# Patient Record
Sex: Female | Born: 1965 | Race: White | Hispanic: No | Marital: Married | Smoking: Former smoker
Health system: Southern US, Community
[De-identification: ages and names within clinical notes are randomized; demographics above are authoritative.]

## PROBLEM LIST (undated history)

## (undated) DIAGNOSIS — R002 Palpitations: Secondary | ICD-10-CM

## (undated) DIAGNOSIS — Z8711 Personal history of peptic ulcer disease: Secondary | ICD-10-CM

## (undated) DIAGNOSIS — Z8719 Personal history of other diseases of the digestive system: Secondary | ICD-10-CM

## (undated) DIAGNOSIS — R519 Headache, unspecified: Secondary | ICD-10-CM

## (undated) DIAGNOSIS — R51 Headache: Secondary | ICD-10-CM

## (undated) HISTORY — DX: Personal history of other diseases of the digestive system: Z87.19

## (undated) HISTORY — DX: Headache, unspecified: R51.9

## (undated) HISTORY — DX: Headache: R51

## (undated) HISTORY — DX: Palpitations: R00.2

## (undated) HISTORY — PX: AUGMENTATION MAMMAPLASTY: SUR837

## (undated) HISTORY — DX: Personal history of peptic ulcer disease: Z87.11

---

## 2003-05-13 ENCOUNTER — Other Ambulatory Visit: Admission: RE | Admit: 2003-05-13 | Discharge: 2003-05-13 | Payer: Self-pay | Admitting: Family Medicine

## 2004-11-13 ENCOUNTER — Other Ambulatory Visit: Admission: RE | Admit: 2004-11-13 | Discharge: 2004-11-13 | Payer: Self-pay | Admitting: Obstetrics and Gynecology

## 2005-12-08 ENCOUNTER — Ambulatory Visit: Payer: Self-pay | Admitting: Internal Medicine

## 2006-04-27 ENCOUNTER — Ambulatory Visit: Payer: Self-pay | Admitting: Internal Medicine

## 2007-01-02 ENCOUNTER — Ambulatory Visit: Payer: Self-pay | Admitting: Internal Medicine

## 2007-04-18 ENCOUNTER — Ambulatory Visit: Payer: Self-pay | Admitting: Internal Medicine

## 2008-01-31 ENCOUNTER — Encounter: Admission: RE | Admit: 2008-01-31 | Discharge: 2008-01-31 | Payer: Self-pay | Admitting: Obstetrics & Gynecology

## 2009-05-05 ENCOUNTER — Encounter: Admission: RE | Admit: 2009-05-05 | Discharge: 2009-05-05 | Payer: Self-pay | Admitting: Obstetrics & Gynecology

## 2010-05-07 ENCOUNTER — Encounter: Admission: RE | Admit: 2010-05-07 | Discharge: 2010-05-07 | Payer: Self-pay | Admitting: Obstetrics & Gynecology

## 2010-12-13 ENCOUNTER — Encounter: Payer: Self-pay | Admitting: Obstetrics & Gynecology

## 2011-06-30 ENCOUNTER — Other Ambulatory Visit: Payer: Self-pay | Admitting: Obstetrics & Gynecology

## 2011-06-30 DIAGNOSIS — Z1231 Encounter for screening mammogram for malignant neoplasm of breast: Secondary | ICD-10-CM

## 2011-07-09 ENCOUNTER — Ambulatory Visit: Payer: Self-pay

## 2011-07-12 ENCOUNTER — Ambulatory Visit
Admission: RE | Admit: 2011-07-12 | Discharge: 2011-07-12 | Disposition: A | Discharge status: Home / Self Care | Payer: 59 | Source: Ambulatory Visit | Attending: Obstetrics & Gynecology | Admitting: Obstetrics & Gynecology

## 2011-07-12 DIAGNOSIS — Z1231 Encounter for screening mammogram for malignant neoplasm of breast: Secondary | ICD-10-CM

## 2012-06-13 ENCOUNTER — Other Ambulatory Visit: Payer: Self-pay | Admitting: Obstetrics & Gynecology

## 2012-06-13 DIAGNOSIS — Z1231 Encounter for screening mammogram for malignant neoplasm of breast: Secondary | ICD-10-CM

## 2012-06-13 DIAGNOSIS — Z9882 Breast implant status: Secondary | ICD-10-CM

## 2012-07-13 ENCOUNTER — Ambulatory Visit
Admission: RE | Admit: 2012-07-13 | Discharge: 2012-07-13 | Disposition: A | Discharge status: Home / Self Care | Payer: 59 | Source: Ambulatory Visit | Attending: Obstetrics & Gynecology | Admitting: Obstetrics & Gynecology

## 2012-07-13 DIAGNOSIS — Z1231 Encounter for screening mammogram for malignant neoplasm of breast: Secondary | ICD-10-CM

## 2012-07-13 DIAGNOSIS — Z9882 Breast implant status: Secondary | ICD-10-CM

## 2012-09-04 ENCOUNTER — Other Ambulatory Visit: Payer: Self-pay | Admitting: Obstetrics & Gynecology

## 2013-07-10 ENCOUNTER — Other Ambulatory Visit: Payer: Self-pay

## 2013-07-10 DIAGNOSIS — Z1231 Encounter for screening mammogram for malignant neoplasm of breast: Secondary | ICD-10-CM

## 2013-08-23 ENCOUNTER — Ambulatory Visit
Admission: RE | Admit: 2013-08-23 | Discharge: 2013-08-23 | Disposition: A | Discharge status: Home / Self Care | Payer: 59 | Source: Ambulatory Visit

## 2013-08-23 DIAGNOSIS — Z1231 Encounter for screening mammogram for malignant neoplasm of breast: Secondary | ICD-10-CM

## 2013-09-17 ENCOUNTER — Other Ambulatory Visit: Payer: Self-pay | Admitting: Obstetrics & Gynecology

## 2014-07-24 ENCOUNTER — Other Ambulatory Visit: Payer: Self-pay

## 2014-07-24 DIAGNOSIS — Z1231 Encounter for screening mammogram for malignant neoplasm of breast: Secondary | ICD-10-CM

## 2014-08-26 ENCOUNTER — Ambulatory Visit
Admission: RE | Admit: 2014-08-26 | Discharge: 2014-08-26 | Disposition: A | Discharge status: Home / Self Care | Payer: 59 | Source: Ambulatory Visit

## 2014-08-26 DIAGNOSIS — Z1231 Encounter for screening mammogram for malignant neoplasm of breast: Secondary | ICD-10-CM

## 2014-09-23 ENCOUNTER — Other Ambulatory Visit: Payer: Self-pay | Admitting: Obstetrics & Gynecology

## 2014-09-24 LAB — CYTOLOGY - PAP

## 2015-09-19 ENCOUNTER — Other Ambulatory Visit: Payer: Self-pay

## 2015-09-19 DIAGNOSIS — Z1231 Encounter for screening mammogram for malignant neoplasm of breast: Secondary | ICD-10-CM

## 2015-09-30 ENCOUNTER — Ambulatory Visit
Admission: RE | Admit: 2015-09-30 | Discharge: 2015-09-30 | Disposition: A | Discharge status: Home / Self Care | Payer: PRIVATE HEALTH INSURANCE | Source: Ambulatory Visit

## 2015-09-30 DIAGNOSIS — Z1231 Encounter for screening mammogram for malignant neoplasm of breast: Secondary | ICD-10-CM

## 2015-10-02 ENCOUNTER — Other Ambulatory Visit: Payer: Self-pay | Admitting: Obstetrics & Gynecology

## 2015-10-02 DIAGNOSIS — R928 Other abnormal and inconclusive findings on diagnostic imaging of breast: Secondary | ICD-10-CM

## 2015-10-08 ENCOUNTER — Ambulatory Visit
Admission: RE | Admit: 2015-10-08 | Discharge: 2015-10-08 | Disposition: A | Discharge status: Home / Self Care | Payer: PRIVATE HEALTH INSURANCE | Source: Ambulatory Visit | Attending: Obstetrics & Gynecology | Admitting: Obstetrics & Gynecology

## 2015-10-08 DIAGNOSIS — R928 Other abnormal and inconclusive findings on diagnostic imaging of breast: Secondary | ICD-10-CM

## 2015-10-21 ENCOUNTER — Telehealth: Payer: Self-pay | Admitting: Cardiovascular Disease

## 2015-10-21 NOTE — Telephone Encounter (Signed)
Received records from [facility] for appointment on 11/07/15 with Dr Oval Linsey.  Records given to [facility] (medical records) for Dr Blenda Mounts schedule on 11/07/15. lp

## 2015-11-07 ENCOUNTER — Ambulatory Visit: Payer: PRIVATE HEALTH INSURANCE | Admitting: Cardiovascular Disease

## 2015-11-10 ENCOUNTER — Ambulatory Visit (INDEPENDENT_AMBULATORY_CARE_PROVIDER_SITE_OTHER): Payer: PRIVATE HEALTH INSURANCE | Admitting: Cardiovascular Disease

## 2015-11-10 ENCOUNTER — Ambulatory Visit: Payer: PRIVATE HEALTH INSURANCE

## 2015-11-10 ENCOUNTER — Encounter: Payer: Self-pay | Admitting: Cardiovascular Disease

## 2015-11-10 VITALS — BP 138/90 | HR 72 | Ht 62.0 in | Wt 142.9 lb

## 2015-11-10 DIAGNOSIS — R002 Palpitations: Secondary | ICD-10-CM

## 2015-11-10 NOTE — Progress Notes (Signed)
     11/10/2015 Rachel Mcneil   05-31-1966  [id number]  Primary Physician Rachel Fruit, MD Primary Cardiologist: Rachel Harp MD Rachel Mcneil   HPI:  Mr. Rachel Mcneil is a delightful 49 year old thin-appearing married Caucasian female mother of one 59 year old child who is retired Cabin crew and was referred by Dr. Stann Mcneil, her OB/GYN for evaluation of palpitations. She has no cardiac risk factors. 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 She's been under a lot of stress since that time. She does admit to drinking 3 cups of coffee a morning and ice tea throughout the day. There is no family history for heart disease. She's never had a heart attack or stroke and denies chest pain or shortness of breath. Her primary care physician has ordered a full set of labs to be drawn next week.   No current outpatient prescriptions on file.   No current facility-administered medications for this visit.    No Known Allergies  Social History   Social History  . Marital Status: Married    Spouse Name: N/A  . Number of Children: N/A  . Years of Education: N/A   Occupational History  . Not on file.   Social History Main Topics  . Smoking status: Former Research scientist (life sciences)  . Smokeless tobacco: Never Used  . Alcohol Use: Not on file  . Drug Use: Not on file  . Sexual Activity: Not on file   Other Topics Concern  . Not on file   Social History Narrative  . No narrative on file     Review of Systems: General: negative for chills, fever, night sweats or weight changes.  Cardiovascular: negative for chest pain, dyspnea on exertion, edema, orthopnea, palpitations, paroxysmal nocturnal dyspnea or shortness of breath Dermatological: negative for rash Respiratory: negative for cough or wheezing Urologic: negative for hematuria Abdominal: negative for nausea, vomiting, diarrhea, bright red blood per rectum, melena, or hematemesis Neurologic: negative for visual changes, syncope, or  dizziness All other systems reviewed and are otherwise negative except as noted above.    Blood pressure 138/90, pulse 72, height 5\' 2"  (1.575 m), weight 142 lb 14.4 oz (64.819 kg).  General appearance: alert and no distress Neck: no adenopathy, no carotid bruit, no JVD, supple, symmetrical, trachea midline and thyroid not enlarged, symmetric, no tenderness/mass/nodules Lungs: clear to auscultation bilaterally Heart: regular rate and rhythm, S1, S2 normal, no murmur, click, rub or gallop Extremities: extremities normal, atraumatic, no cyanosis or edema  EKG normal sinus rhythm at 72 with a ST or T wave changes. I personally reviewed this EKG  ASSESSMENT AND PLAN:   Palpitations Rachel Mcneil is a very delightful 49 year old thin and fit appearing very Caucasian female mother of one 60 year old son who is referred by Dr. Stann Mcneil , her OB/GYN, for evaluation of palpitations.she does admit to being under a lot of stress having retired within the last year. She drinks 3 cups of coffee in the morning and ice tea throughout the day. She denies chest pain, shortness of breath or presyncope. I'm going to get a third event monitor and will see her back after that. We talked about the importance of reducing her caffeine intake.      Rachel Harp MD FACP,FACC,FAHA, [facility] 11/10/2015 5:04 PM

## 2015-11-10 NOTE — Patient Instructions (Addendum)
Medication Instructions:  Your physician recommends that you continue on your current medications as directed. Please refer to the Current Medication list given to you today.  Labwork: NONE  Testing/Procedures: Your physician has recommended that you wear an 30 day event monitor. Event monitors are medical devices that record the heart's electrical activity. Doctors most often [place] these monitors to diagnose arrhythmias. Arrhythmias are problems with the speed or rhythm of the heartbeat. The monitor is a small, portable device. You can wear one while you do your normal daily activities. This is usually used to diagnose what is causing palpitations/syncope (passing out).  Follow-Up: We request that you follow-up in:  6 weeks with Dr Gwenlyn Found   If you need a refill on your cardiac medications before your next appointment, please call your pharmacy.

## 2015-11-10 NOTE — Assessment & Plan Note (Signed)
Rachel Mcneil is a very delightful 49 year old thin and fit appearing very Caucasian female mother of one 65 year old son who is referred by Dr. Stann Mainland , her OB/GYN, for evaluation of palpitations.she does admit to being under a lot of stress having retired within the last year. She drinks 3 cups of coffee in the morning and ice tea throughout the day. She denies chest pain, shortness of breath or presyncope. I'm going to get a third event monitor and will see her back after that. We talked about the importance of reducing her caffeine intake.

## 2015-11-12 ENCOUNTER — Encounter (INDEPENDENT_AMBULATORY_CARE_PROVIDER_SITE_OTHER): Payer: PRIVATE HEALTH INSURANCE

## 2015-11-12 DIAGNOSIS — R002 Palpitations: Secondary | ICD-10-CM

## 2016-01-09 ENCOUNTER — Ambulatory Visit: Payer: PRIVATE HEALTH INSURANCE | Admitting: Cardiovascular Disease

## 2016-09-24 ENCOUNTER — Other Ambulatory Visit: Payer: Self-pay | Admitting: Obstetrics & Gynecology

## 2016-09-24 DIAGNOSIS — Z1231 Encounter for screening mammogram for malignant neoplasm of breast: Secondary | ICD-10-CM

## 2016-10-22 ENCOUNTER — Ambulatory Visit
Admission: RE | Admit: 2016-10-22 | Discharge: 2016-10-22 | Disposition: A | Discharge status: Home / Self Care | Payer: PRIVATE HEALTH INSURANCE | Source: Ambulatory Visit | Attending: Obstetrics & Gynecology | Admitting: Obstetrics & Gynecology

## 2016-10-22 DIAGNOSIS — Z1231 Encounter for screening mammogram for malignant neoplasm of breast: Secondary | ICD-10-CM

## 2017-11-25 ENCOUNTER — Other Ambulatory Visit: Payer: Self-pay | Admitting: Obstetrics & Gynecology

## 2017-11-25 DIAGNOSIS — Z1239 Encounter for other screening for malignant neoplasm of breast: Secondary | ICD-10-CM

## 2017-11-29 ENCOUNTER — Ambulatory Visit
Admission: RE | Admit: 2017-11-29 | Discharge: 2017-11-29 | Disposition: A | Discharge status: Home / Self Care | Payer: PRIVATE HEALTH INSURANCE | Source: Ambulatory Visit | Attending: Obstetrics & Gynecology | Admitting: Obstetrics & Gynecology

## 2017-11-29 ENCOUNTER — Ambulatory Visit: Payer: PRIVATE HEALTH INSURANCE

## 2017-11-29 DIAGNOSIS — Z1239 Encounter for other screening for malignant neoplasm of breast: Secondary | ICD-10-CM

## 2018-07-26 ENCOUNTER — Encounter: Payer: Self-pay | Admitting: Family Medicine

## 2018-07-26 ENCOUNTER — Ambulatory Visit (INDEPENDENT_AMBULATORY_CARE_PROVIDER_SITE_OTHER): Payer: PRIVATE HEALTH INSURANCE

## 2018-07-26 ENCOUNTER — Ambulatory Visit (INDEPENDENT_AMBULATORY_CARE_PROVIDER_SITE_OTHER): Payer: PRIVATE HEALTH INSURANCE | Admitting: Family Medicine

## 2018-07-26 VITALS — BP 130/84 | HR 65 | Temp 98.1°F | Ht 62.0 in | Wt 149.6 lb

## 2018-07-26 DIAGNOSIS — Z0001 Encounter for general adult medical examination with abnormal findings: Secondary | ICD-10-CM

## 2018-07-26 DIAGNOSIS — M25552 Pain in left hip: Secondary | ICD-10-CM

## 2018-07-26 DIAGNOSIS — Z975 Presence of (intrauterine) contraceptive device: Secondary | ICD-10-CM | POA: Insufficient documentation

## 2018-07-26 DIAGNOSIS — Z23 Encounter for immunization: Secondary | ICD-10-CM | POA: Diagnosis not present

## 2018-07-26 DIAGNOSIS — R5383 Other fatigue: Secondary | ICD-10-CM

## 2018-07-26 DIAGNOSIS — Z114 Encounter for screening for human immunodeficiency virus [HIV]: Secondary | ICD-10-CM

## 2018-07-26 DIAGNOSIS — Z0000 Encounter for general adult medical examination without abnormal findings: Secondary | ICD-10-CM

## 2018-07-26 DIAGNOSIS — Z1211 Encounter for screening for malignant neoplasm of colon: Secondary | ICD-10-CM

## 2018-07-26 LAB — CBC WITH DIFFERENTIAL/PLATELET
Basophils Absolute: 0 10*3/uL (ref 0.0–0.1)
Basophils Relative: 0.7 % (ref 0.0–3.0)
EOS ABS: 0.1 10*3/uL (ref 0.0–0.7)
Eosinophils Relative: 0.9 % (ref 0.0–5.0)
HEMATOCRIT: 42.5 % (ref 36.0–46.0)
HEMOGLOBIN: 14.8 g/dL (ref 12.0–15.0)
LYMPHS PCT: 19.2 % (ref 12.0–46.0)
Lymphs Abs: 1.2 10*3/uL (ref 0.7–4.0)
MCHC: 34.9 g/dL (ref 30.0–36.0)
MCV: 93.2 fl (ref 78.0–100.0)
MONOS PCT: 8.6 % (ref 3.0–12.0)
Monocytes Absolute: 0.5 10*3/uL (ref 0.1–1.0)
Neutro Abs: 4.5 10*3/uL (ref 1.4–7.7)
Neutrophils Relative %: 70.6 % (ref 43.0–77.0)
Platelets: 286 10*3/uL (ref 150.0–400.0)
RBC: 4.56 Mil/uL (ref 3.87–5.11)
RDW: 12.9 % (ref 11.5–15.5)
WBC: 6.4 10*3/uL (ref 4.0–10.5)

## 2018-07-26 LAB — COMPREHENSIVE METABOLIC PANEL
ALBUMIN: 4.1 g/dL (ref 3.5–5.2)
ALT: 11 U/L (ref 0–35)
AST: 13 U/L (ref 0–37)
Alkaline Phosphatase: 36 U/L — ABNORMAL LOW (ref 39–117)
BILIRUBIN TOTAL: 0.7 mg/dL (ref 0.2–1.2)
BUN: 14 mg/dL (ref 6–23)
CALCIUM: 9.4 mg/dL (ref 8.4–10.5)
CHLORIDE: 102 meq/L (ref 96–112)
CO2: 30 mEq/L (ref 19–32)
CREATININE: 0.64 mg/dL (ref 0.40–1.20)
GFR: 103.72 mL/min (ref >60.00)
Glucose, Bld: 80 mg/dL (ref 70–99)
Potassium: 4.1 mEq/L (ref 3.5–5.1)
SODIUM: 136 meq/L (ref 135–145)
Total Protein: 6.6 g/dL (ref 6.0–8.3)

## 2018-07-26 LAB — LUTEINIZING HORMONE: LH: 2.3 m[IU]/mL

## 2018-07-26 LAB — LIPID PANEL
CHOLESTEROL: 204 mg/dL — AB (ref 0–200)
HDL: 62.7 mg/dL (ref >39.00)
LDL CALC: 122 mg/dL — AB (ref 0–99)
NonHDL: 141.21
Total CHOL/HDL Ratio: 3
Triglycerides: 95 mg/dL (ref 0.0–149.0)
VLDL: 19 mg/dL (ref 0.0–40.0)

## 2018-07-26 LAB — TSH: TSH: 1.39 u[IU]/mL (ref 0.35–4.50)

## 2018-07-26 LAB — FOLLICLE STIMULATING HORMONE: FSH: 2.8 m[IU]/mL

## 2018-07-26 LAB — VITAMIN D 25 HYDROXY (VIT D DEFICIENCY, FRACTURES): VITD: 20.15 ng/mL — ABNORMAL LOW (ref 30.00–100.00)

## 2018-07-26 NOTE — Patient Instructions (Addendum)
Health Maintenance Due  Topic Date Due  . HIV Screening -ordered today 12/09/1981  . TETANUS/TDAP -ordered today 12/09/1985  . COLONOSCOPY  12/09/2016  . INFLUENZA VACCINE -pt declines 06/22/2018   Tumeric for arthritis.  If you take ibuprofen could take with food and a zantac.

## 2018-07-26 NOTE — Progress Notes (Signed)
Patient: Rachel Mcneil MRN: [id number] DOB: [birth year] PCP: Vania Rea, MD     Subjective:  Chief Complaint  Patient presents with  . Establish Care  . needs referral for ortho    left hip pain    HPI: The patient is a 52 y.o. female who presents today for annual exam. She denies any changes to past medical history. There have been no recent hospitalizations. They are following a well balanced diet and exercise plan. Weight has been decreasing steadily.  She is not sure if menopausal. Has IUD. She is wondering if she is pre menopausal and wondering about hormone levels. She has no motivation to do stuff and feels more fatigued. She denies any depression, but also feels more snappy lately. She has had a few night sweats as well.    Left hip pain: she has had left hip pain since February. No injury that she recalls. Pain is pretty constant and has to sit a certain way. Pain comes and goes, but is daily. Pain rated 6/10 and is described as dull. Pain does not radiate. She has not taken otc pain pills and nothing seems to make it better but laying down. Walking/weight bearing makes it worse. Occasionally has left knee pain.   Immunization History  Administered Date(s) Administered  . Tdap 07/26/2018     Colonoscopy: never had this.  Mammogram: 08/2017: normal  Pap smear: 2 years ago. Normal.  Tdap: unsure.    Review of Systems  Constitutional: Positive for activity change and fatigue. Negative for appetite change.  Respiratory: Negative for shortness of breath.   Cardiovascular: Negative for chest pain.  Gastrointestinal: Negative for abdominal pain and nausea.  Musculoskeletal: Positive for arthralgias (left hip pain). Negative for back pain and neck pain.  Neurological: Negative for dizziness and headaches.  Psychiatric/Behavioral: Negative for sleep disturbance. The patient is not nervous/anxious.     Allergies Patient has No Known Allergies.  Past Medical  History Patient  has a past medical history of Frequent headaches, History of stomach ulcers, and Palpitations.  Surgical History Patient  has a past surgical history that includes Augmentation mammaplasty (Bilateral).  Family History Pateint's family history includes Cancer in her father; Early death in her father; Heart disease in her mother; Hyperlipidemia in her mother; Hypertension in her mother; Pancreatic cancer in her father; Stroke in her mother.  Social History Patient  reports that she has quit smoking. Her smoking use included cigarettes. She has never used smokeless tobacco. She reports that she drank alcohol. She reports that she does not use drugs.    Objective: Vitals:   07/26/18 1014 07/26/18 1049  BP: 120/90 130/84  Pulse: 65   Temp: 98.1 F (36.7 C)   TempSrc: Oral   SpO2: 98%   Weight: 149 lb 9.6 oz (67.9 kg)   Height: 5\' 2"  (1.575 m)     Body mass index is 27.36 kg/m.  Physical Exam  Constitutional: She is oriented to person, place, and time. She appears well-developed and well-nourished.  HENT:  Right Ear: External ear normal.  Left Ear: External ear normal.  Mouth/Throat: Oropharynx is clear and moist.  Eyes: Pupils are equal, round, and reactive to light. Conjunctivae and EOM are normal.  Neck: Normal range of motion. Neck supple. No thyromegaly present.  Cardiovascular: Normal rate, regular rhythm, normal heart sounds and intact distal pulses.  No murmur heard. Pulmonary/Chest: Effort normal and breath sounds normal.  Abdominal: Soft. Bowel sounds are normal. She exhibits no distension.  There is no tenderness.  Musculoskeletal: Normal range of motion. She exhibits tenderness (over left hip).  Lymphadenopathy:    She has no cervical adenopathy.  Neurological: She is alert and oriented to person, place, and time. She displays normal reflexes. No cranial nerve deficit. Coordination normal.  Skin: Skin is warm and dry. No rash noted.  Psychiatric: She  has a normal mood and affect. Her behavior is normal.  Vitals reviewed.      Assessment/plan: 1. Annual physical exam utd on her mmg/pap. Giving tdap today and ordering screening cscope.  Patient counseling [x]    Nutrition: Stressed importance of moderation in sodium/caffeine intake, saturated fat and cholesterol, caloric balance, sufficient intake of fresh fruits, vegetables, fiber, calcium, iron, and 1 mg of folate supplement per day (for females capable of pregnancy).  [x]    Stressed the importance of regular exercise.   []    Substance Abuse: Discussed cessation/primary prevention of tobacco, alcohol, or other drug use; driving or other dangerous activities under the influence; availability of treatment for abuse.   [x]    Injury prevention: Discussed safety belts, safety helmets, smoke detector, smoking near bedding or upholstery.   [x]    Sexuality: Discussed sexually transmitted diseases, partner selection, use of condoms, avoidance of unintended pregnancy  and contraceptive alternatives.  [x]    Dental health: Discussed importance of regular tooth brushing, flossing, and dental visits.  [x]    Health maintenance and immunizations reviewed. Please refer to Health maintenance section.   - CBC with Differential/Platelet - TSH - Comprehensive metabolic panel - Lipid panel  2. Other fatigue Will check her hormones. May be secondary to menopause, she is at mean age for this. Also check vitamin D and other labs to rule out other causes. Recommended continued exercise/sleep. F/u on labs.  - VITAMIN D 25 Hydroxy (Vit-D Deficiency, Fractures) - Luteinizing hormone - Follicle stimulating hormone  3. Encounter for screening for HIV  - HIV antibody  4. Need for prophylactic vaccination with combined diphtheria-tetanus-pertussis (DTP) vaccine  - Tdap vaccine greater than or equal to 7yo IM  5. Left hip pain Bursitis vs. OA. She is going to see who she would like to be referred to. Will let  me know. Recommended daily tumeric and NSAIDs for severe pain since she does not like taking medication. Recommended she take this with food.   - DG HIP UNILAT W OR W/O PELVIS 2-3 VIEWS LEFT; Future  6. Encounter for screening colonoscopy discussed screening for colon cancer.  - Ambulatory referral to Gastroenterology    Return in about 1 year (around 07/27/2019).     Orma Flaming, MD [facility]  07/26/2018

## 2018-07-27 ENCOUNTER — Other Ambulatory Visit: Payer: Self-pay | Admitting: Family Medicine

## 2018-07-27 DIAGNOSIS — M25552 Pain in left hip: Secondary | ICD-10-CM

## 2018-07-27 LAB — HIV ANTIBODY (ROUTINE TESTING W REFLEX): HIV: NONREACTIVE

## 2018-07-27 MED ORDER — VITAMIN D (ERGOCALCIFEROL) 1.25 MG (50000 UNIT) PO CAPS: ORAL_CAPSULE | ORAL | 0 refills | Status: DC

## 2018-07-27 NOTE — Progress Notes (Signed)
Vit  

## 2018-09-14 ENCOUNTER — Encounter: Payer: Self-pay | Admitting: Family Medicine

## 2018-10-22 ENCOUNTER — Other Ambulatory Visit: Payer: Self-pay | Admitting: Family Medicine

## 2018-11-29 ENCOUNTER — Encounter: Payer: Self-pay | Admitting: Gastroenterology

## 2018-11-30 ENCOUNTER — Ambulatory Visit (INDEPENDENT_AMBULATORY_CARE_PROVIDER_SITE_OTHER): Payer: Self-pay | Admitting: Family Medicine

## 2018-11-30 ENCOUNTER — Encounter: Payer: Self-pay | Admitting: Family Medicine

## 2018-11-30 ENCOUNTER — Other Ambulatory Visit (HOSPITAL_COMMUNITY)
Admission: RE | Admit: 2018-11-30 | Discharge: 2018-11-30 | Disposition: A | Discharge status: Home / Self Care | Payer: 59 | Source: Ambulatory Visit | Attending: Family Medicine | Admitting: Family Medicine

## 2018-11-30 VITALS — BP 110/66 | HR 71 | Temp 98.1°F | Ht 62.0 in | Wt 148.8 lb

## 2018-11-30 DIAGNOSIS — Z01419 Encounter for gynecological examination (general) (routine) without abnormal findings: Secondary | ICD-10-CM | POA: Diagnosis not present

## 2018-11-30 DIAGNOSIS — R8290 Unspecified abnormal findings in urine: Secondary | ICD-10-CM

## 2018-11-30 DIAGNOSIS — Z1151 Encounter for screening for human papillomavirus (HPV): Secondary | ICD-10-CM | POA: Diagnosis not present

## 2018-11-30 DIAGNOSIS — Z975 Presence of (intrauterine) contraceptive device: Secondary | ICD-10-CM | POA: Diagnosis not present

## 2018-11-30 LAB — POCT URINALYSIS DIPSTICK
Bilirubin, UA: NEGATIVE
GLUCOSE UA: NEGATIVE
KETONES UA: NEGATIVE
LEUKOCYTES UA: NEGATIVE
NITRITE UA: NEGATIVE
PROTEIN UA: NEGATIVE
SPEC GRAV UA: 1.025 (ref 1.010–1.025)
Urobilinogen, UA: 2 E.U./dL — AB
pH, UA: 6 (ref 5.0–8.0)

## 2018-11-30 NOTE — Progress Notes (Signed)
SUBJECTIVE:  53 y.o. female for annual routine Pap and checkup. She currently has IUD in place. Having very irregular nad prolonged periods with spotting. Menses at age 8 years of age. Normal periods. Her periods just recently became abnormal. IUD in place 2016. No vaginal complaints. No pain with sex. She does have cloudy urine with a smell, but no dysuria. She does not drink enough water. No breast complaints. Due for mmg this month. No hx of breat cancer or colon cancer. Getting colonoscopy next week.   Current Outpatient Medications  Medication Sig Dispense Refill  . cholecalciferol (VITAMIN D3) 25 MCG (1000 UT) tablet Take 1,000 Units by mouth daily.     No current facility-administered medications for this visit.    Allergies: Patient has no known allergies.  No LMP recorded (lmp unknown). (Menstrual status: IUD).  ROS:  Feeling well. No dyspnea or chest pain on exertion.  No abdominal pain, change in bowel habits, black or bloody stools.  No urinary tract symptoms. GYN ROS: normal menses, no abnormal bleeding, pelvic pain or discharge, no breast pain or new or enlarging lumps on self exam. No neurological complaints.  OBJECTIVE:  The patient appears well, alert, oriented x 3, in no distress. BP 110/66 (BP Location: Left Arm, Patient Position: Sitting, Cuff Size: Normal)   Pulse 71   Temp 98.1 F (36.7 C) (Oral)   Ht 5\' 2"  (1.575 m)   Wt 148 lb 12.8 oz (67.5 kg)   LMP  (LMP Unknown)   SpO2 98%   BMI 27.22 kg/m  ENT normal.  Neck supple. No adenopathy or thyromegaly. PERLA. Lungs are clear, good air entry, no wheezes, rhonchi or rales. S1 and S2 normal, no murmurs, regular rate and rhythm. Abdomen soft without tenderness, guarding, mass or organomegaly. Extremities show no edema, normal peripheral pulses. Neurological is normal, no focal findings.  BREAST EXAM: breasts appear normal, no suspicious masses, no skin or nipple changes or axillary nodes  PELVIC EXAM: normal external  genitalia, vulva, vagina, cervix, uterus and adnexa  ASSESSMENT:  well woman  PLAN:  mammogram pap smear return annually or prn cscope next week UA/culture for urine odor/color change-sounds more concentrated. Recommended increase water intake.   Come back for annual/labs this year.   Orma Flaming, MD [facility]

## 2018-12-01 LAB — CYTOLOGY - PAP
DIAGNOSIS: NEGATIVE
HPV (WINDOPATH): NOT DETECTED

## 2018-12-01 LAB — URINE CULTURE
MICRO NUMBER: 33824
RESULT: NO GROWTH
SPECIMEN QUALITY:: ADEQUATE

## 2018-12-05 ENCOUNTER — Ambulatory Visit (AMBULATORY_SURGERY_CENTER): Payer: Self-pay | Admitting: *Deleted

## 2018-12-05 VITALS — Ht 62.0 in | Wt 149.0 lb

## 2018-12-05 DIAGNOSIS — Z1211 Encounter for screening for malignant neoplasm of colon: Secondary | ICD-10-CM

## 2018-12-05 MED ORDER — NA SULFATE-K SULFATE-MG SULF 17.5-3.13-1.6 GM/177ML PO SOLN: 1.0000 | Freq: Once | ORAL | 0 refills | Status: AC

## 2018-12-05 NOTE — Progress Notes (Signed)
No egg or soy allergy known to patient  No issues with past sedation with any surgeries  or procedures, no intubation problems  No diet pills per patient No home 02 use per patient  No blood thinners per patient  Pt denies issues with constipation  No A fib or A flutter  EMMI video sent to pt's e mail --pt declined  Online Suprep $15 coupon to pt

## 2018-12-06 ENCOUNTER — Other Ambulatory Visit: Payer: Self-pay | Admitting: Family Medicine

## 2018-12-06 DIAGNOSIS — Z1231 Encounter for screening mammogram for malignant neoplasm of breast: Secondary | ICD-10-CM

## 2018-12-07 ENCOUNTER — Ambulatory Visit
Admission: RE | Admit: 2018-12-07 | Discharge: 2018-12-07 | Disposition: A | Discharge status: Home / Self Care | Payer: 59 | Source: Ambulatory Visit | Attending: Family Medicine | Admitting: Family Medicine

## 2018-12-07 DIAGNOSIS — Z1231 Encounter for screening mammogram for malignant neoplasm of breast: Secondary | ICD-10-CM

## 2018-12-08 ENCOUNTER — Encounter: Payer: Self-pay | Admitting: Gastroenterology

## 2018-12-13 ENCOUNTER — Encounter: Payer: Self-pay | Admitting: Gastroenterology

## 2018-12-13 ENCOUNTER — Ambulatory Visit (AMBULATORY_SURGERY_CENTER): Payer: 59 | Admitting: Gastroenterology

## 2018-12-13 VITALS — BP 111/70 | HR 66 | Temp 98.6°F | Resp 14 | Ht 62.0 in | Wt 149.0 lb

## 2018-12-13 DIAGNOSIS — D120 Benign neoplasm of cecum: Secondary | ICD-10-CM

## 2018-12-13 DIAGNOSIS — Z1211 Encounter for screening for malignant neoplasm of colon: Secondary | ICD-10-CM | POA: Diagnosis not present

## 2018-12-13 MED ORDER — SODIUM CHLORIDE 0.9 % IV SOLN: 500.0000 mL | Freq: Once | INTRAVENOUS | Status: DC

## 2018-12-13 NOTE — Progress Notes (Signed)
Called to room to assist during endoscopic procedure.  Patient ID and intended procedure confirmed with present staff. Received instructions for my participation in the procedure from the performing physician.  

## 2018-12-13 NOTE — Op Note (Signed)
[facility] Patient Name: Rachel Mcneil Procedure Date: 12/13/2018 10:50 AM MRN: [id number] Endoscopist: Remo Lipps P. Havery Moros , MD Age: 53 Referring MD:  Date of Birth: 06/29/1966 Gender: Female Account #: [id number] Procedure:                Colonoscopy Indications:              Screening for colorectal malignant neoplasm, This                            is the patient's first colonoscopy Medicines:                Monitored Anesthesia Care Procedure:                Pre-Anesthesia Assessment:                           - Prior to the procedure, a History and Physical                            was performed, and patient medications and                            allergies were reviewed. The patient's tolerance of                            previous anesthesia was also reviewed. The risks                            and benefits of the procedure and the sedation                            options and risks were discussed with the patient.                            All questions were answered, and informed consent                            was obtained. Prior Anticoagulants: The patient has                            taken no previous anticoagulant or antiplatelet                            agents. ASA Grade Assessment: II - A patient with                            mild systemic disease. After reviewing the risks                            and benefits, the patient was deemed in                            satisfactory condition to undergo the procedure.  After obtaining informed consent, the colonoscope                            was passed under direct vision. Throughout the                            procedure, the patient's blood pressure, pulse, and                            oxygen saturations were monitored continuously. The                            Model PCF-[id number]) scope was introduced                            through the anus  and advanced to the the cecum,                            identified by appendiceal orifice and ileocecal                            valve. The colonoscopy was performed without                            difficulty. The patient tolerated the procedure                            well. The quality of the bowel preparation was                            adequate. The ileocecal valve, appendiceal orifice,                            and rectum were photographed. Scope In: 10:56:27 AM Scope Out: 11:23:32 AM Scope Withdrawal Time: 0 hours 19 minutes 36 seconds  Total Procedure Duration: 0 hours 27 minutes 5 seconds  Findings:                 The perianal and digital rectal examinations were                            normal.                           The colon was quite tortuous.                           A moderate amount of semi-liquid stool was found in                            the entire colon, making visualization difficult.                            Lavage of the area was performed using copious  amounts of sterile water, resulting in clearance                            with adequate visualization. This prolonged the                            procedure, as the colonoscope severely clogged at                            one point, time taken to treat the colonoscope.                           Two sessile polyps were found in the cecum. The                            polyps were 3 to 4 mm in size. These polyps were                            removed with a cold snare. Resection and retrieval                            were complete.                           Anal papilla(e) were hypertrophied.                           The exam was otherwise without abnormality. Complications:            No immediate complications. Estimated blood loss:                            Minimal. Estimated Blood Loss:     Estimated blood loss was minimal. Impression:               -  Tortuous colon.                           - Two 3 to 4 mm polyps in the cecum, removed with a                            cold snare. Resected and retrieved.                           - Anal papilla(e) were hypertrophied.                           - The examination was otherwise normal. Recommendation:           - Patient has a contact number available for                            emergencies. The signs and symptoms of potential                            delayed  complications were discussed with the                            patient. Return to normal activities tomorrow.                            Written discharge instructions were provided to the                            patient.                           - Resume previous diet.                           - Continue present medications.                           - Await pathology results. Remo Lipps P. Leonia Heatherly, MD 12/13/2018 11:27:55 AM This report has been signed electronically.

## 2018-12-13 NOTE — Patient Instructions (Signed)
YOU HAD AN ENDOSCOPIC PROCEDURE TODAY AT THE [facility]:   Refer to the procedure report that was given to you for any specific questions about what was found during the examination.  If the procedure report does not answer your questions, please call your gastroenterologist to clarify.  If you requested that your care partner not be given the details of your procedure findings, then the procedure report has been included in a sealed envelope for you to review at your convenience later.  YOU SHOULD EXPECT: Some feelings of bloating in the abdomen. Passage of more gas than usual.  Walking can help get rid of the air that was put into your GI tract during the procedure and reduce the bloating. If you had a lower endoscopy (such as a colonoscopy or flexible sigmoidoscopy) you may notice spotting of blood in your stool or on the toilet paper. If you underwent a bowel prep for your procedure, you may not have a normal bowel movement for a few days.  Please Note:  You might notice some irritation and congestion in your nose or some drainage.  This is from the oxygen used during your procedure.  There is no need for concern and it should clear up in a day or so.  SYMPTOMS TO REPORT IMMEDIATELY:   Following lower endoscopy (colonoscopy or flexible sigmoidoscopy):  Excessive amounts of blood in the stool  Significant tenderness or worsening of abdominal pains  Swelling of the abdomen that is new, acute  Fever of 100F or higher  For urgent or emergent issues, a gastroenterologist can be reached at any hour by calling [phone].   DIET:  We do recommend a small meal at first, but then you may proceed to your regular diet.  Drink plenty of fluids but you should avoid alcoholic beverages for 24 hours.  ACTIVITY:  You should plan to take it easy for the rest of today and you should NOT DRIVE or use heavy machinery until tomorrow (because of the sedation medicines used during the test).     FOLLOW UP: Our staff will call the number listed on your records the next business day following your procedure to check on you and address any questions or concerns that you may have regarding the information given to you following your procedure. If we do not reach you, we will leave a message.  However, if you are feeling well and you are not experiencing any problems, there is no need to return our call.  We will assume that you have returned to your regular daily activities without incident.  If any biopsies were taken you will be contacted by phone or by letter within the next 1-3 weeks.  Please call us at [phone] if you have not heard about the biopsies in 3 weeks.    SIGNATURES/CONFIDENTIALITY: You and/or your care partner have signed paperwork which will be entered into your electronic medical record.  These signatures attest to the fact that that the information above on your After Visit Summary has been reviewed and is understood.  Full responsibility of the confidentiality of this discharge information lies with you and/or your care-partner. 

## 2018-12-13 NOTE — Progress Notes (Signed)
Pt's states no medical or surgical changes since previsit or office visit. 

## 2018-12-14 ENCOUNTER — Telehealth: Payer: Self-pay

## 2018-12-14 ENCOUNTER — Telehealth: Payer: Self-pay | Admitting: *Deleted

## 2018-12-14 NOTE — Telephone Encounter (Signed)
No answer, left message to call back later today, B.Necha Harries RN. 

## 2018-12-14 NOTE — Telephone Encounter (Signed)
NO ANSWER, MESSAGE LEFT FOR THE PATIENT. 

## 2019-06-27 ENCOUNTER — Encounter: Payer: Self-pay | Admitting: Internal Medicine

## 2019-06-27 ENCOUNTER — Other Ambulatory Visit: Payer: Self-pay

## 2019-06-27 ENCOUNTER — Ambulatory Visit (INDEPENDENT_AMBULATORY_CARE_PROVIDER_SITE_OTHER): Payer: 59 | Admitting: Internal Medicine

## 2019-06-27 DIAGNOSIS — R05 Cough: Secondary | ICD-10-CM

## 2019-06-27 DIAGNOSIS — R053 Chronic cough: Secondary | ICD-10-CM

## 2019-06-27 MED ORDER — HYDROCODONE-HOMATROPINE 5-1.5 MG/5ML PO SYRP: 5.0000 mL | ORAL_SOLUTION | Freq: Four times a day (QID) | ORAL | 0 refills | Status: DC | PRN

## 2019-06-27 NOTE — Progress Notes (Signed)
Virtual Visit via Video Note  I connected with@ on 06/27/19 at  3:45 PM EDT by a video enabled telemedicine application and verified that I am speaking with the correct person using two identifiers. Location patient: home Location provider:work  office Persons participating in the virtual visit: patient, provider  [place] national recommendations  regarding COVID 19 pandemic   video visit is advised over in office visit for this patient.  Patient aware  of the limitations of evaluation and management by telemedicine and  availability of in person appointments. and agreed to proceed.   HPI: Rachel Mcneil presents for video visit PCP NA  Generally well until 2 weeks ago and developed a cough  That has persisted without fever systemic sx  Sob  Rash or uri sx  May have hx of some "allergy sx congestion and sneezing but not happening now)   Just not going away and interferes with sleep    r dm somewhat helpful but not enough .    Using cough drops all the time   No ets/tobacco is a relator no exposures had neg covid testing last week.   No hx of wheezing but feels like a whistle feeling in upper chest . No sob  Remote hx of similar persistent  cough after a resp infection long time ago but no hx asthma  Lung disease  2 cats 2 dogs at home  None new .   ROS: See pertinent positives and negatives per HPI. No gerd sx  Past Medical History:  Diagnosis Date  . Frequent headaches   . History of stomach ulcers   . Palpitations     Past Surgical History:  Procedure Laterality Date  . AUGMENTATION MAMMAPLASTY Bilateral     Family History  Problem Relation Age of Onset  . Hypertension Mother   . Stroke Mother   . Hyperlipidemia Mother   . Heart disease Mother   . Pancreatic cancer Father   . Cancer Father   . Early death Father   . Colon cancer Neg Hx   . Colon polyps Neg Hx   . Rectal cancer Neg Hx   . Stomach cancer Neg Hx     Social History   Tobacco Use  . Smoking status:  Former Smoker    Types: Cigarettes  . Smokeless tobacco: Never Used  Substance Use Topics  . Alcohol use: Not Currently    Alcohol/week: 0.0 standard drinks    Comment: occ  . Drug use: Never      Current Outpatient Medications:  .  cholecalciferol (VITAMIN D3) 25 MCG (1000 UT) tablet, Take 1,000 Units by mouth daily., Disp: , Rfl:  .  HYDROcodone-homatropine (HYCODAN) 5-1.5 MG/5ML syrup, Take 5 mLs by mouth every 6 (six) hours as needed for cough., Disp: 90 mL, Rfl: 0  EXAM: BP Readings from Last 3 Encounters:  12/13/18 111/70  11/30/18 110/66  07/26/18 130/84    VITALS per patient if applicable:  GENERAL: alert, oriented, appears well and in no acute distress ocass dry cough  Episodes  Appears generally well. HEENT: atraumatic, conjunttiva clear, no obvious abnormalities on inspection of external nose and ears NECK: normal movements of the head and neck LUNGS: on inspection no signs of respiratory distress, breathing rate appears normal, no obvious gross SOB, gasping or wheezing no stridor   CV: no obvious cyanosis MS: moves all visible extremities without noticeable abnormality PSYCH/NEURO: pleasant and cooperative, no obvious depression or anxiety, speech and thought processing grossly intact  ASSESSMENT AND PLAN:  Discussed the following assessment and plan:    ICD-10-CM   1. Cough, persistent  R05    change cough drops to sugar free candy  Add antihistamine  Cough suppressant offered at night  Hydration and   Observe if  persistent or progressive over next 2 weeks or worsening fever etc sob then fu with PCP or [place]   Counseled.   Expectant management and discussion of plan and treatment with opportunity to ask questions and all were answered. The patient agreed with the plan and demonstrated an understanding of the instructions.   Advised to call back or seek an in-person evaluation if worsening  or having  further concerns .   Shanon Ace, MD

## 2019-08-01 ENCOUNTER — Ambulatory Visit: Payer: Self-pay | Admitting: *Deleted

## 2019-08-01 NOTE — Telephone Encounter (Signed)
Patient is calling with concerns about her BP and blood vessels in the eye. Patient states she has had 2 episodes of blood vessels in the eye bursting- during treatment for shingles and now again today. Patient is concerned that her BP may have something to do with the reason this is occurring. Did discuss the reasons why blood vessels burst in the eye- and this maybe something to see her opthalmology about. Call to office to schedule appointment.  Reason for Disposition . [id number] Systolic BP  >= [id number] OR Diastolic >= 80 AND [id number] not taking BP medications  Answer Assessment - Initial Assessment Questions 1. BLOOD PRESSURE: "What is the blood pressure?" "Did you take at least two measurements 5 minutes apart?"     sporadic elevated BP- 136/77 (this morning), last night-144/77, couple days ago-153/79 2. ONSET: "When did you take your blood pressure?"     8/26 and this week end had blood vessel burst in L eye 3. HOW: "How did you obtain the blood pressure?" (e.g., visiting nurse, automatic home BP monitor)     Automatic cuff 4. HISTORY: "Do you have a history of high blood pressure?"     No personal of high BP 5. MEDICATIONS: "Are you taking any medications for blood pressure?" "Have you missed any doses recently?"     No medication 6. OTHER SYMPTOMS: "Do you have any symptoms?" (e.g., headache, chest pain, blurred vision, difficulty breathing, weakness)     Feels like her vision is more blurry than normal- eyes watering 7. PREGNANCY: "Is there any chance you are pregnant?" "When was your last menstrual period?"     n/a  Protocols used: HIGH BLOOD PRESSURE-A-AH

## 2019-08-03 ENCOUNTER — Ambulatory Visit (INDEPENDENT_AMBULATORY_CARE_PROVIDER_SITE_OTHER): Payer: No Typology Code available for payment source | Admitting: Family Medicine

## 2019-08-03 ENCOUNTER — Other Ambulatory Visit: Payer: Self-pay

## 2019-08-03 ENCOUNTER — Encounter: Payer: Self-pay | Admitting: Family Medicine

## 2019-08-03 VITALS — BP 112/70 | HR 64 | Temp 98.3°F | Ht 62.0 in | Wt 153.8 lb

## 2019-08-03 DIAGNOSIS — I998 Other disorder of circulatory system: Secondary | ICD-10-CM

## 2019-08-03 NOTE — Patient Instructions (Signed)

## 2019-08-03 NOTE — Progress Notes (Signed)
Patient: Rachel Mcneil MRN: [id number] DOB: 18-Dec-1965 PCP: Orma Flaming, MD     Subjective:  Chief Complaint  Patient presents with  . blood pressure issues    HPI: The patient is a 53 y.o. female who presents today for issues with several episodes of elevated blood pressure, increasing headaches and some instances of visual changes at the time when blood pressure is up.  Recently diagnosed with shingles x 4 wks ago, treated w/Valtrex and Prednisone but has stopped taking these.  She states the highest was 153/79   And it was in the evening. She finds it higher in the evening. Sometimes when she takes it is fine. She makes sure she is sitting and has rested. Blood vessels have popped in her eye twice which made her think to take her pressure. Her sclera gets quite red and then goes away. She has not had her eyes checked yet. She does have a headache in the back of her head that she gets every other day. This is chronic and not new prior to blood pressure. No vision changes, leg swelling, shortness of breath or chest pain.   Review of Systems  Constitutional: Negative for fatigue.  Respiratory: Negative for cough and shortness of breath.   Cardiovascular: Negative for chest pain, palpitations and leg swelling.  Gastrointestinal: Negative for nausea and vomiting.  Musculoskeletal: Negative for back pain, myalgias, neck pain and neck stiffness.  Neurological: Positive for headaches. Negative for dizziness and light-headedness.    Allergies Patient has No Known Allergies.  Past Medical History Patient  has a past medical history of Frequent headaches, History of stomach ulcers, and Palpitations.  Surgical History Patient  has a past surgical history that includes Augmentation mammaplasty (Bilateral).  Family History Pateint's family history includes Cancer in her father; Early death in her father; Heart disease in her mother; Hyperlipidemia in her mother; Hypertension in her  mother; Pancreatic cancer in her father; Stroke in her mother.  Social History Patient  reports that she has quit smoking. Her smoking use included cigarettes. She has never used smokeless tobacco. She reports previous alcohol use. She reports that she does not use drugs.    Objective: Vitals:   08/03/19 1134  BP: 112/70  Pulse: 64  Temp: 98.3 F (36.8 C)  TempSrc: Skin  SpO2: 99%  Weight: 153 lb 12.8 oz (69.8 kg)  Height: 5\' 2"  (1.575 m)    Body mass index is 28.13 kg/m.  Physical Exam Vitals signs reviewed.  Constitutional:      Appearance: Normal appearance. She is normal weight.  HENT:     Head: Normocephalic and atraumatic.     Mouth/Throat:     Mouth: Mucous membranes are moist.  Eyes:     Extraocular Movements: Extraocular movements intact.     Conjunctiva/sclera: Conjunctivae normal.     Pupils: Pupils are equal, round, and reactive to light.  Neck:     Musculoskeletal: Normal range of motion and neck supple.  Cardiovascular:     Rate and Rhythm: Normal rate and regular rhythm.     Heart sounds: Normal heart sounds.  Pulmonary:     Effort: Pulmonary effort is normal.     Breath sounds: Normal breath sounds.  Abdominal:     General: Abdomen is flat. Bowel sounds are normal.     Palpations: Abdomen is soft.  Neurological:     General: No focal deficit present.     Mental Status: She is alert and oriented to person,  place, and time.  Psychiatric:        Mood and Affect: Mood normal.        Behavior: Behavior normal.        Assessment/plan: 1. Fluctuating blood pressure Blood pressure normal on 2 checks today. Discussed no indication to check pressure at this time. Prednisone could have been the culprit on initial high reads. She can continue to keep log at home, but don't want her checking daily. Work on [employer] and weight loss and she is to let me know if consistently above 140/90. Due for annual/labs and will come back for this and we can recheck blood  pressure again at that time.    Return in about 1 month (around 09/02/2019) for annual/labs .    Orma Flaming, MD [place]   08/03/2019

## 2019-09-17 IMAGING — DX DG HIP (WITH OR WITHOUT PELVIS) 2-3V*L*
3 series · 3 of 3 positions shown · non-contrast
Comparison: None.

CLINICAL DATA: Left hip pain for 6 months.

EXAM:
DG HIP (WITH OR WITHOUT PELVIS) 2-3V LEFT

[pelvis ap]
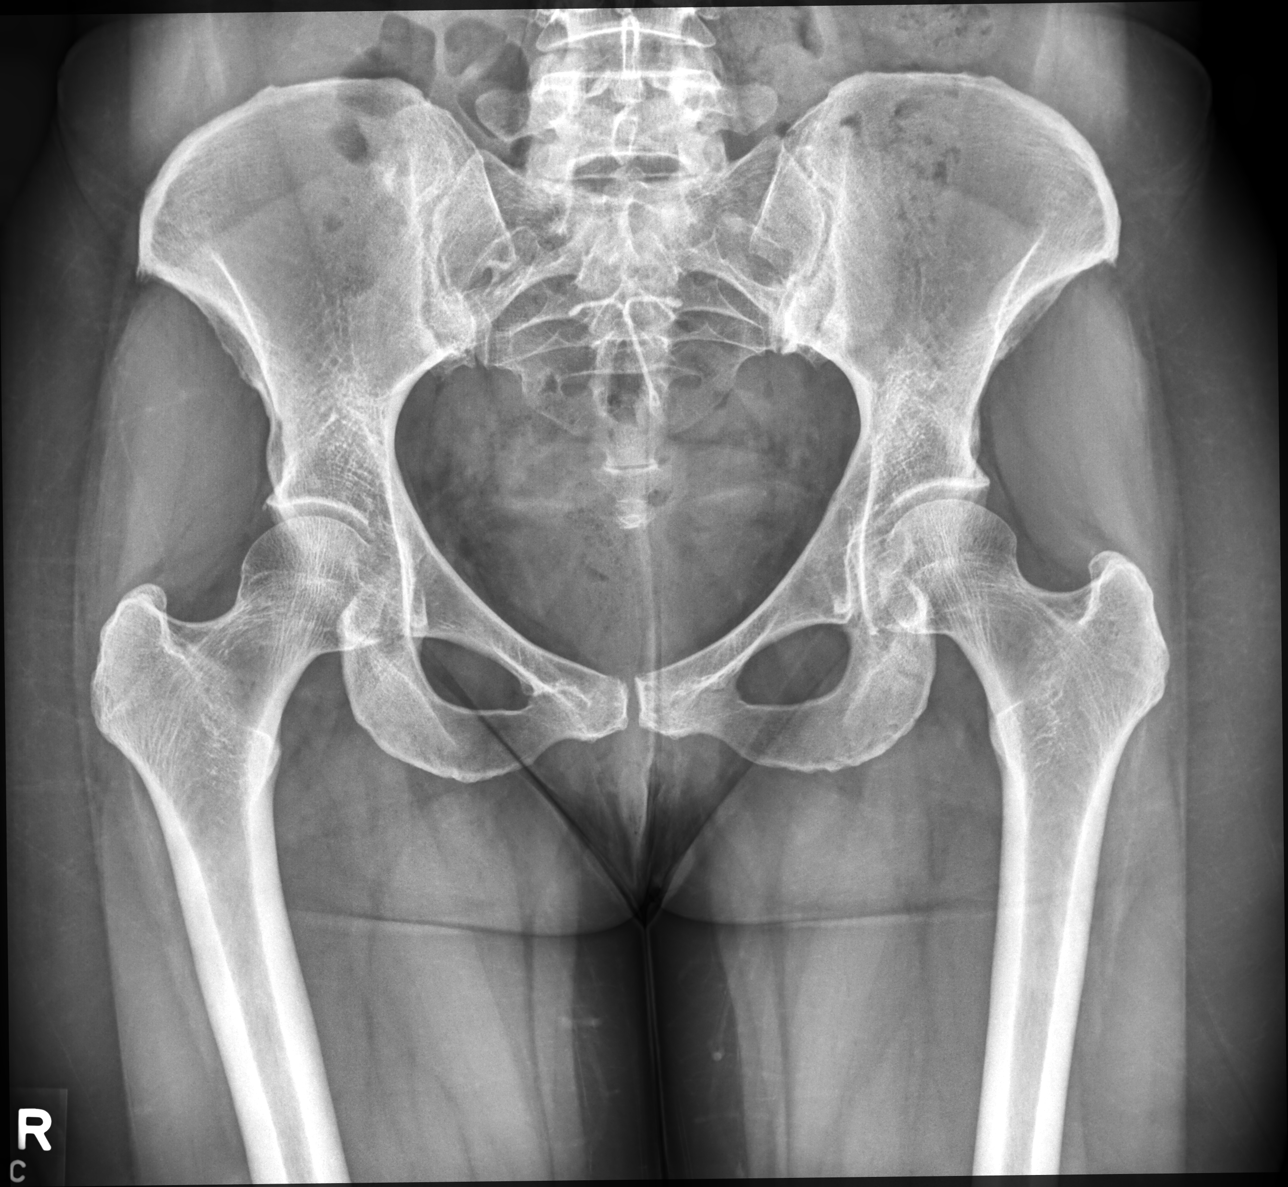

[hip joint ap]
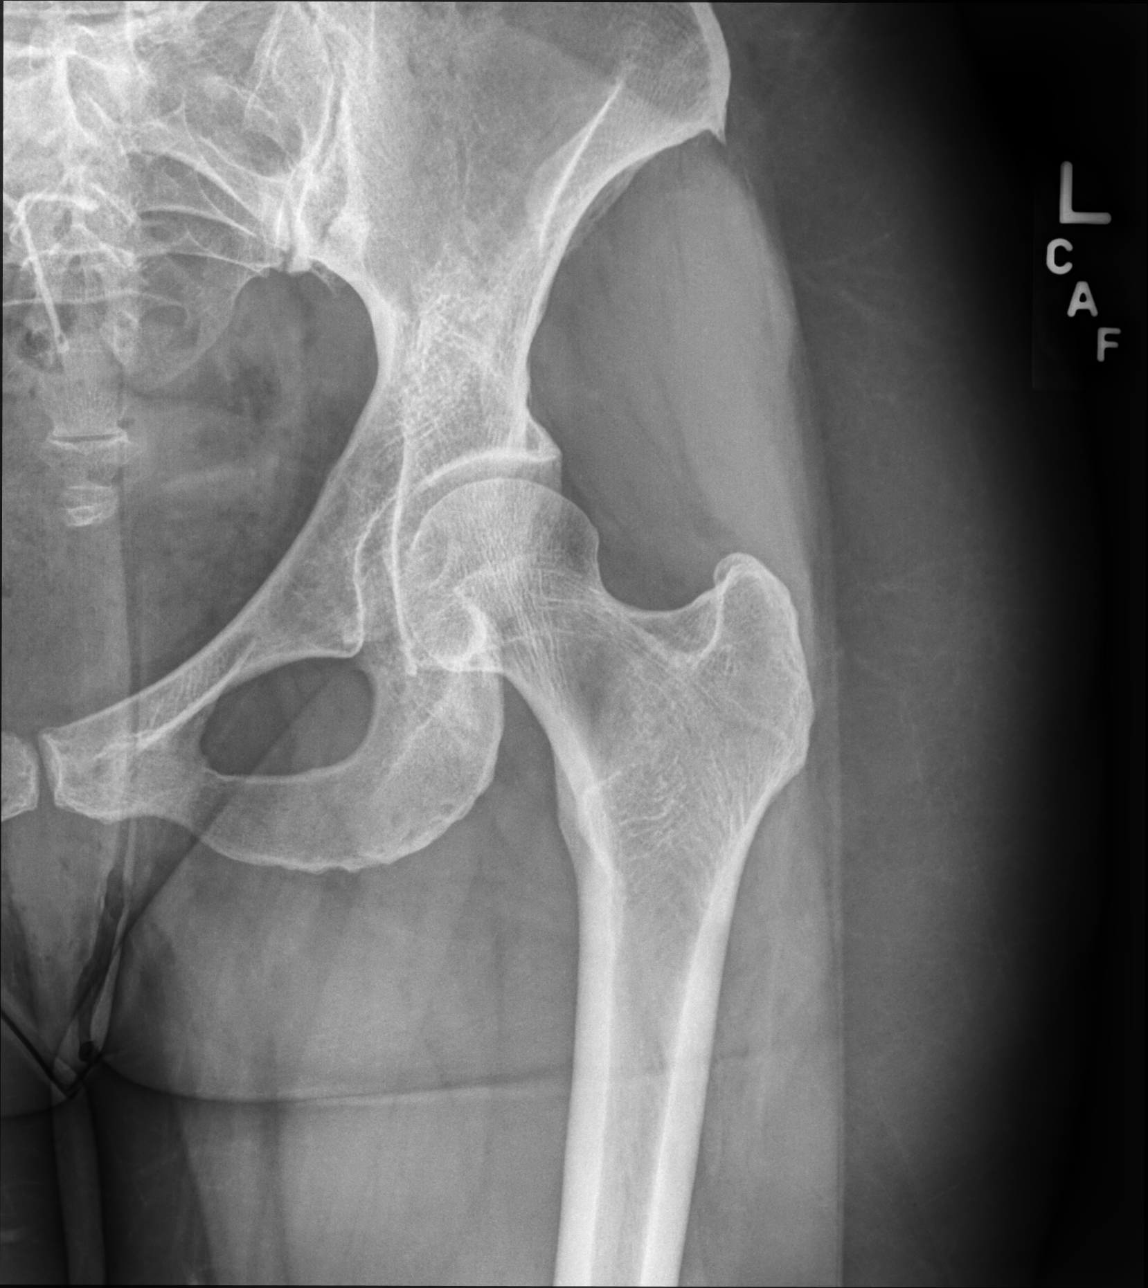

[hip joint (frog view)]
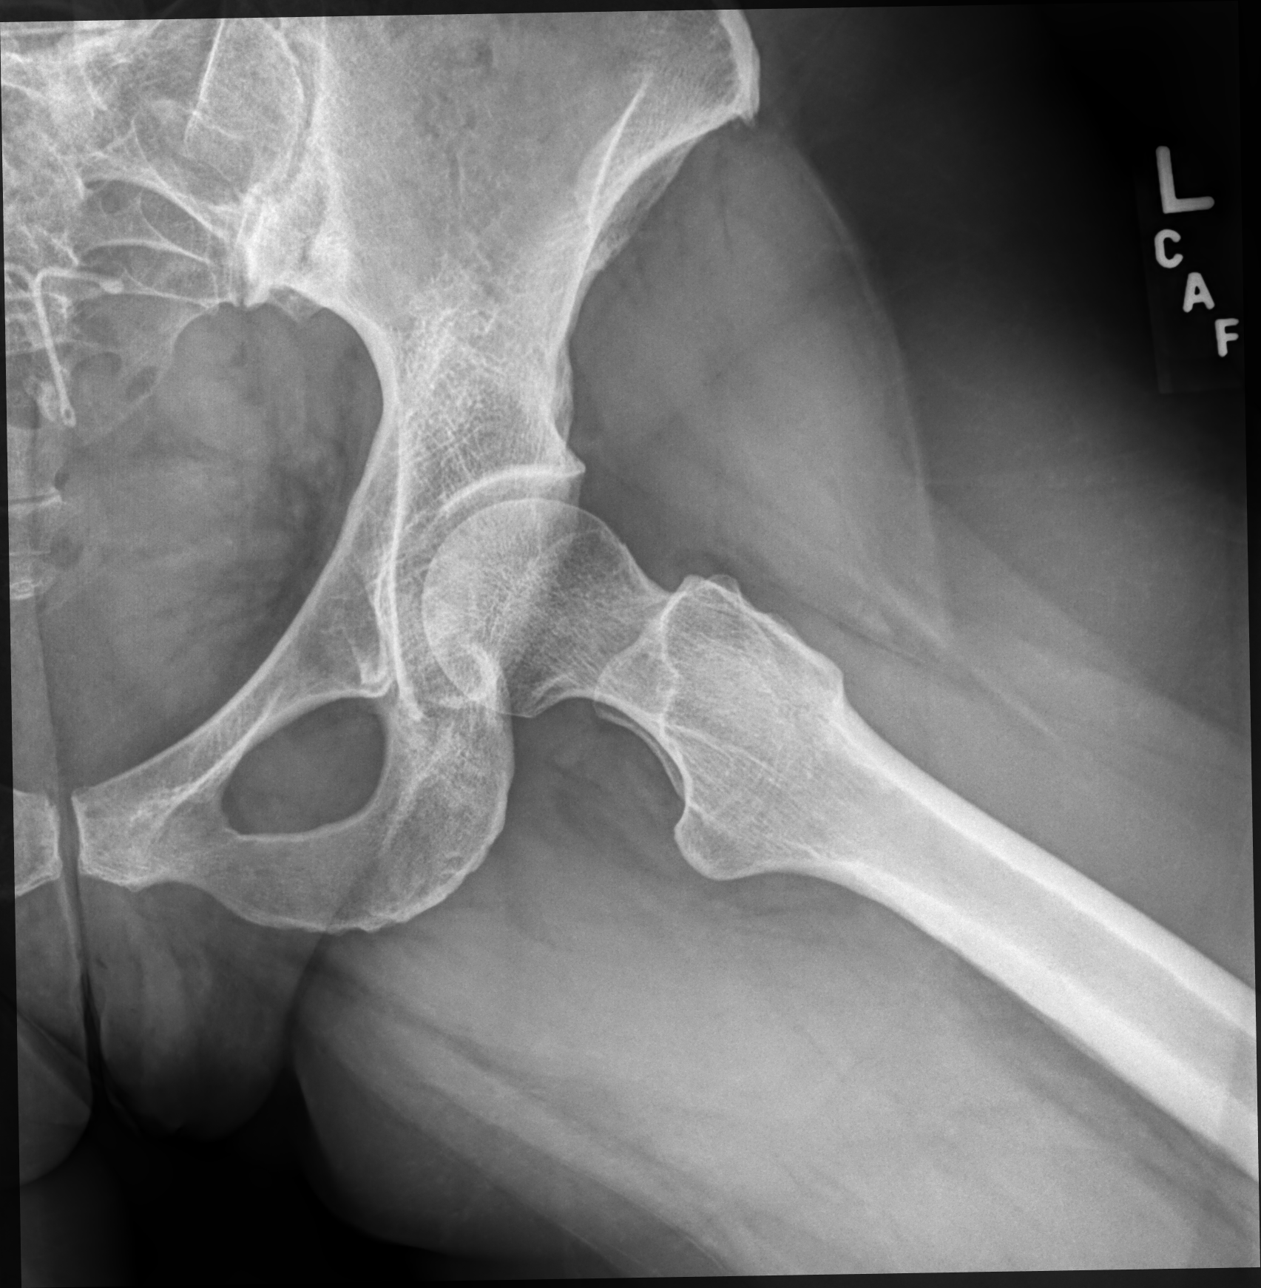

[3 of 3 positions shown; findings below may reference images not displayed]

FINDINGS: There is no evidence of fracture or hip dislocation. Hip joint space
widths are preserved without significant arthropathic changes
identified. SI joints are unremarkable. An IUD projects over the
pelvis in the midline.
IMPRESSION: Negative.

## 2020-12-23 ENCOUNTER — Ambulatory Visit: Payer: No Typology Code available for payment source | Admitting: Dermatology

## 2021-08-25 ENCOUNTER — Other Ambulatory Visit: Payer: Self-pay | Admitting: Family Medicine

## 2021-08-25 DIAGNOSIS — Z1231 Encounter for screening mammogram for malignant neoplasm of breast: Secondary | ICD-10-CM

## 2021-09-22 ENCOUNTER — Ambulatory Visit
Admission: RE | Admit: 2021-09-22 | Discharge: 2021-09-22 | Disposition: A | Discharge status: Home / Self Care | Payer: No Typology Code available for payment source | Source: Ambulatory Visit | Attending: Family Medicine | Admitting: Family Medicine

## 2021-09-22 ENCOUNTER — Other Ambulatory Visit: Payer: Self-pay

## 2021-09-22 DIAGNOSIS — Z1231 Encounter for screening mammogram for malignant neoplasm of breast: Secondary | ICD-10-CM

## 2022-04-13 ENCOUNTER — Encounter: Payer: No Typology Code available for payment source | Admitting: Internal Medicine

## 2022-04-13 ENCOUNTER — Telehealth: Payer: No Typology Code available for payment source | Admitting: Internal Medicine

## 2022-04-13 NOTE — Progress Notes (Unsigned)
  Virtual Visit via Video Note  I connected with Rachel Mcneil on 04/13/22 at  8:45 AM EDT by a video enabled telemedicine application and verified that I am speaking with the correct person using two identifiers. Location patient: home Location provider:work office Persons participating in the virtual visit: patient, provider   HPI: Rachel Mcneil presents for video visit multiple attempts to do video connection  . Sent me to [patients waiting room  multiple times    10 minutes attempts . Referred to staffing to make this work       ROS: See pertinent positives and negatives per HPI.  Past Medical History:  Diagnosis Date   Frequent headaches    History of stomach ulcers    Palpitations     Past Surgical History:  Procedure Laterality Date   AUGMENTATION MAMMAPLASTY Bilateral    84 years ago    Family History  Problem Relation Age of Onset   Hypertension Mother    Stroke Mother    Hyperlipidemia Mother    Heart disease Mother    Pancreatic cancer Father    Cancer Father    Early death Father    Colon cancer Neg Hx    Colon polyps Neg Hx    Rectal cancer Neg Hx    Stomach cancer Neg Hx     Social History   Tobacco Use   Smoking status: Former    Types: Cigarettes   Smokeless tobacco: Never  Vaping Use   Vaping Use: Never used  Substance Use Topics   Alcohol use: Not Currently    Alcohol/week: 0.0 standard drinks    Comment: occ   Drug use: Never     No current outpatient medications on file.  EXAM: BP Readings from Last 3 Encounters:  08/03/19 112/70  12/13/18 111/70  11/30/18 110/66    VITALS per patient if applicable:  GENERAL: alert, oriented, appears well and in no acute distress  HEENT: atraumatic, conjunttiva clear, no obvious abnormalities on inspection of external nose and ears  NECK: normal movements of the head and neck  LUNGS: on inspection no signs of respiratory distress, breathing rate appears normal, no obvious gross SOB,  gasping or wheezing  CV: no obvious cyanosis  MS: moves all visible extremities without noticeable abnormality  PSYCH/NEURO: pleasant and cooperative, no obvious depression or anxiety, speech and thought processing grossly intact Lab Results  Component Value Date   WBC 6.4 07/26/2018   HGB 14.8 07/26/2018   HCT 42.5 07/26/2018   PLT 286.0 07/26/2018   GLUCOSE 80 07/26/2018   CHOL 204 (H) 07/26/2018   TRIG 95.0 07/26/2018   HDL 62.70 07/26/2018   LDLCALC 122 (H) 07/26/2018   ALT 11 07/26/2018   AST 13 07/26/2018   NA 136 07/26/2018   K 4.1 07/26/2018   CL 102 07/26/2018   CREATININE 0.64 07/26/2018   BUN 14 07/26/2018   CO2 30 07/26/2018   TSH 1.39 07/26/2018    ASSESSMENT AND PLAN:  Discussed the following assessment and plan:  No diagnosis found.  Counseled.   Expectant management and discussion of plan and treatment with opportunity to ask questions and all were answered. The patient agreed with the plan and demonstrated an understanding of the instructions.   Advised to call back or seek an in-person evaluation if worsening  or having  further concerns  in interim. No follow-ups on file.    Shanon Ace, MD

## 2023-10-27 ENCOUNTER — Telehealth: Payer: Self-pay | Admitting: Obstetrics & Gynecology

## 2023-10-27 NOTE — Telephone Encounter (Signed)
Pt would like to re-est with dr Fabian Sharp last seen md 06-27-2019

## 2023-10-28 NOTE — Telephone Encounter (Signed)
Ok to reestablish  as patient

## 2023-11-01 NOTE — Telephone Encounter (Signed)
Pt sch for 11-22-2023

## 2023-11-21 NOTE — Progress Notes (Addendum)
 Chief Complaint  Patient presents with   Establish Care    HPI: Patient  Rachel Mcneil  57 y.o. comes in today for  new patient visit  , Preventive Health Care visit or as indicated   But will have gyne check soon.  Has had colonoscopy and mammogram  Has been on low dose compounded ozempic  0.25 weekly for a while  began at 165 and above weight  BP tends to b labile  up and down but no definitive dx of ht. Doesnt have  home monitor.  Gets seconds up to 5 of palpitations not related to activity   and no assoc sx   Health Maintenance  Topic Date Due   Hepatitis C Screening  Never done   Cervical Cancer Screening (HPV/Pap Cotest)  12/01/2023   Colonoscopy  12/14/2023   COVID-19 Vaccine (3 - 2024-25 season) 12/08/2023 (Originally 07/24/2023)   INFLUENZA VACCINE  02/20/2024 (Originally 06/23/2023)   MAMMOGRAM  05/21/2024 (Originally 09/23/2023)   Zoster Vaccines- Shingrix (1 of 2) 05/21/2024 (Originally 12/09/2016)   DTaP/Tdap/Td (2 - Td or Tdap) 07/26/2028   HIV Screening  Completed   HPV VACCINES  Aged Out   Health Maintenance Review LIFESTYLE:  Exercise:   none  Tobacco/ETS: husband outside  Alcohol:  weekends 2  Sugar beverages: no Caffeine 3 c coffee in am and then tea x3? In afternoon Sleep: 7 hours  Drug use: no HH of  2  2 cats  Work: 50 per week  is a [employer] Readings from Last 3 Encounters:  11/22/23 146 lb 3.2 oz (66.3 kg)  08/03/19 153 lb 12.8 oz (69.8 kg)  12/13/18 149 lb (67.6 kg)     ROS:  GEN/ HEENT: No fever, significant weight changes sweats vision problems hearing changes, CV/ PULM; No chest pain shortness of breath cough, syncope,edema  change in exercise tolerance. GI /GU: No adominal pain, vomiting, change in bowel habits. No blood in the stool. No significant GU symptoms. SKIN/HEME: ,no acute skin rashes suspicious lesions or bleeding. No lymphadenopathy, nodules, masses.  NEURO/ PSYCH:  No neurologic signs such as weakness numbness. No  depression anxiety. IMM/ Allergy: No unusual infections.  Allergy .   REST of 12 system review negative except as per HPI   Past Medical History:  Diagnosis Date   Frequent headaches    History of stomach ulcers    Palpitations     Past Surgical History:  Procedure Laterality Date   AUGMENTATION MAMMAPLASTY Bilateral    20 years ago    Family History  Problem Relation Age of Onset   Hypertension Mother    Stroke Mother    Hyperlipidemia Mother    Heart disease Mother    Pancreatic cancer Father    Cancer Father    Early death Father    Colon cancer Neg Hx    Colon polyps Neg Hx    Rectal cancer Neg Hx    Stomach cancer Neg Hx     Social History   Socioeconomic History   Marital status: Married    Spouse name: Not on file   Number of children: Not on file   Years of education: Not on file   Highest education level: Not on file  Occupational History   Not on file  Tobacco Use   Smoking status: Former    Types: Cigarettes   Smokeless tobacco: Never  Vaping Use   Vaping status: Never Used  Substance and Sexual Activity  Alcohol use: Not Currently    Alcohol/week: 0.0 standard drinks of alcohol    Comment: occ   Drug use: Never   Sexual activity: Yes  Other Topics Concern   Not on file  Social History Narrative   Not on file   Social Drivers of Health   Financial Resource Strain: Not on file  Food Insecurity: Not on file  Transportation Needs: Not on file  Physical Activity: Inactive (11/22/2023)   Exercise Vital Sign    Days of Exercise per Week: 0 days    Minutes of Exercise per Session: 0 min  Stress: Stress Concern Present (11/22/2023)   [employer] of Occupational Health - Occupational Stress Questionnaire    Feeling of Stress : Rather much  Social Connections: Unknown (04/02/2022)   [facility]   Social Network    Social Network: Not on file    Outpatient Medications Prior to Visit  Medication Sig Dispense Refill    SEMAGLUTIDE,0.25 OR 0.5MG /DOS, [place] Inject into the skin once a week.     No facility-administered medications prior to visit.     EXAM:  BP (!) 146/100 (BP Location: Right Arm, Cuff Size: Normal)   Pulse 69   Temp 97.7 F (36.5 C) (Oral)   Ht 5' 2.5 (1.588 m)   Wt 146 lb 3.2 oz (66.3 kg)   SpO2 98%   BMI 26.31 kg/m   Body mass index is 26.31 kg/m. Wt Readings from Last 3 Encounters:  11/22/23 146 lb 3.2 oz (66.3 kg)  08/03/19 153 lb 12.8 oz (69.8 kg)  12/13/18 149 lb (67.6 kg)    Physical Exam: Vital signs reviewed HZW:Uypd is a well-developed well-nourished alert cooperative    who appearsr stated age in no acute distress.  HEENT: normocephalic atraumatic , Eyes: conjunctiva clear, Nares: paten,t no deformity discharge or tenderness., Ears: no deformity EAC's TMs with normal landmarks. Mouth: clear OP, no lesions, edema.  Moist mucous membranes. Dentition in adequate repair. NECK: supple without masses, thyromegaly or bruits. CHEST/PULM:  Clear to auscultation and percussion breath sounds equal no wheeze , rales or rhonchi. No chest wall deformities or tenderness. CV: PMI is nondisplaced, S1 S2 no gallops, murmurs, rubs. Peripheral pulses are full without delay.No JVD .  ABDOMEN: Bowel sounds normal nontender  No guard or rebound, no hepato splenomegal no CVA tenderness.  Extremtities:  No clubbing cyanosis or edema, no acute joint swelling or redness no focal atrophy NEURO:  Oriented x3, cranial nerves 3-12 appear to be intact, no obvious focal weakness,gait within normal limits no abnormal reflexes or asymmetrical SKIN: No acute rashes normal turgor, color, no bruising or petechiae. Tatoo right upper back PSYCH: Oriented, good eye contact, no obvious depression anxiety, cognition and judgment appear normal. LN: no cervical axillarl adenopathy  Lab Results  Component Value Date   WBC 6.0 11/22/2023   HGB 15.2 (H) 11/22/2023   HCT 44.6 11/22/2023   PLT 270.0 11/22/2023    GLUCOSE 86 11/22/2023   CHOL 253 (H) 11/22/2023   TRIG 90.0 11/22/2023   HDL 58.10 11/22/2023   LDLCALC 177 (H) 11/22/2023   ALT 11 11/22/2023   AST 13 11/22/2023   NA 139 11/22/2023   K 4.5 11/22/2023   CL 103 11/22/2023   CREATININE 0.69 11/22/2023   BUN 13 11/22/2023   CO2 27 11/22/2023   TSH 1.39 11/22/2023   HGBA1C 5.2 11/22/2023    BP Readings from Last 3 Encounters:  11/22/23 (!) 146/100  08/03/19 112/70  12/13/18 111/70   EKG NSR with loss of anterior forces in v2-3  Lab plan reviewed with patient   ASSESSMENT AND PLAN:  Discussed the following assessment and plan:    ICD-10-CM   1. Palpitations  R00.2 Lipid panel    TSH    T4, free    Magnesium    Hepatic function panel    Hemoglobin A1c    CBC with Differential/Platelet    Basic metabolic panel    Lipoprotein A (LPA)    EKG 12-Lead    ECHOCARDIOGRAM COMPLETE    2. Encounter to establish care  Z76.89 Lipid panel    TSH    T4, free    Magnesium    Hepatic function panel    Hemoglobin A1c    CBC with Differential/Platelet    Basic metabolic panel    Lipoprotein A (LPA)    3. Elevated blood pressure reading  R03.0 Lipid panel    TSH    T4, free    Magnesium    Hepatic function panel    Hemoglobin A1c    CBC with Differential/Platelet    Basic metabolic panel    Lipoprotein A (LPA)    CT CARDIAC SCORING (SELF PAY ONLY)    ECHOCARDIOGRAM COMPLETE    4. Hyperlipidemia, unspecified hyperlipidemia type  E78.5 Lipid panel    TSH    T4, free    Magnesium    Hepatic function panel    Hemoglobin A1c    CBC with Differential/Platelet    Basic metabolic panel    Lipoprotein A (LPA)    CT CARDIAC SCORING (SELF PAY ONLY)    ECHOCARDIOGRAM COMPLETE    5. Medication management  Z79.899 Lipid panel    TSH    T4, free    Magnesium    Hepatic function panel    Hemoglobin A1c    CBC with Differential/Platelet    Basic metabolic panel    Lipoprotein A (LPA)    6. Abnormal EKG  R94.31  ECHOCARDIOGRAM COMPLETE    Goal weight  is 140 and then  maintenance .  Is on compounded ozempic low dose  disc resistance exercise and lack of data for long term  Palpitations prob not clinically sig but with bp  age and  EKG  will order echocardiogram ans she is interested in ct calcium score  consider other  eval if  persistent or progressive  No other cv sx .alarming  Bp  control  should be  vetted  Intakes  a good amount of caffeine per day . That can be contributing Lab screening  and update reviewed  Will do shingles vaccine when convenient   Return in about 2 months (around 01/20/2024).  Patient Care Team: Orville Mena K, MD as PCP - General (Internal Medicine) Patient Instructions  Good to see you today . Decrease caffien eas possible. Checking lab. Please bring your blood pressure cuff to next appointment Take blood pressure readings twice a day for 5-7   days and record .     Take 2 -3 readings at each sitting .   Can send in readings  by My Chart.     Before checking your blood pressure make sure: You are seated and quite for 5 min before checking Feet are flat on the floor Siting in chair with your back supported straight up and down Arm resting on table or arm of chair at heart level Bladder is empty You have NOT had caffeine or  tobacco within the last 30 min  [email or web address]  EKG has some changes that may be lead placement   but will consider getting echocardiogram. Or other evaluation as  indicated . Shingles vaccine when convenient . Will also order ct calcium score .  PLan fu in about 2 months  with BP monitor  and go from there   Jayelyn Barno K. Ciin Brazzel M.D.

## 2023-11-22 ENCOUNTER — Ambulatory Visit: Payer: No Typology Code available for payment source | Admitting: Internal Medicine

## 2023-11-22 ENCOUNTER — Encounter: Payer: Self-pay | Admitting: Internal Medicine

## 2023-11-22 VITALS — BP 146/100 | HR 69 | Temp 97.7°F | Ht 62.5 in | Wt 146.2 lb

## 2023-11-22 DIAGNOSIS — Z7689 Persons encountering health services in other specified circumstances: Secondary | ICD-10-CM

## 2023-11-22 DIAGNOSIS — R9431 Abnormal electrocardiogram [ECG] [EKG]: Secondary | ICD-10-CM

## 2023-11-22 DIAGNOSIS — Z79899 Other long term (current) drug therapy: Secondary | ICD-10-CM | POA: Diagnosis not present

## 2023-11-22 DIAGNOSIS — E785 Hyperlipidemia, unspecified: Secondary | ICD-10-CM | POA: Diagnosis not present

## 2023-11-22 DIAGNOSIS — R002 Palpitations: Secondary | ICD-10-CM

## 2023-11-22 DIAGNOSIS — R030 Elevated blood-pressure reading, without diagnosis of hypertension: Secondary | ICD-10-CM | POA: Diagnosis not present

## 2023-11-22 LAB — CBC WITH DIFFERENTIAL/PLATELET
Basophils Absolute: 0 10*3/uL (ref 0.0–0.1)
Basophils Relative: 0.7 % (ref 0.0–3.0)
Eosinophils Absolute: 0.1 10*3/uL (ref 0.0–0.7)
Eosinophils Relative: 1.8 % (ref 0.0–5.0)
HCT: 44.6 % (ref 36.0–46.0)
Hemoglobin: 15.2 g/dL — ABNORMAL HIGH (ref 12.0–15.0)
Lymphocytes Relative: 17.9 % (ref 12.0–46.0)
Lymphs Abs: 1.1 10*3/uL (ref 0.7–4.0)
MCHC: 34.1 g/dL (ref 30.0–36.0)
MCV: 93.4 fL (ref 78.0–100.0)
Monocytes Absolute: 0.6 10*3/uL (ref 0.1–1.0)
Monocytes Relative: 9.3 % (ref 3.0–12.0)
Neutro Abs: 4.2 10*3/uL (ref 1.4–7.7)
Neutrophils Relative %: 70.3 % (ref 43.0–77.0)
Platelets: 270 10*3/uL (ref 150.0–400.0)
RBC: 4.77 Mil/uL (ref 3.87–5.11)
RDW: 12.9 % (ref 11.5–15.5)
WBC: 6 10*3/uL (ref 4.0–10.5)

## 2023-11-22 LAB — HEPATIC FUNCTION PANEL
ALT: 11 U/L (ref 0–35)
AST: 13 U/L (ref 0–37)
Albumin: 4.3 g/dL (ref 3.5–5.2)
Alkaline Phosphatase: 68 U/L (ref 39–117)
Bilirubin, Direct: 0.1 mg/dL (ref 0.0–0.3)
Total Bilirubin: 0.9 mg/dL (ref 0.2–1.2)
Total Protein: 7 g/dL (ref 6.0–8.3)

## 2023-11-22 LAB — TSH: TSH: 1.39 u[IU]/mL (ref 0.35–5.50)

## 2023-11-22 LAB — BASIC METABOLIC PANEL
BUN: 13 mg/dL (ref 6–23)
CO2: 27 meq/L (ref 19–32)
Calcium: 9.8 mg/dL (ref 8.4–10.5)
Chloride: 103 meq/L (ref 96–112)
Creatinine, Ser: 0.69 mg/dL (ref 0.40–1.20)
GFR: 96.66 mL/min (ref >60.00)
Glucose, Bld: 86 mg/dL (ref 70–99)
Potassium: 4.5 meq/L (ref 3.5–5.1)
Sodium: 139 meq/L (ref 135–145)

## 2023-11-22 LAB — LIPID PANEL
Cholesterol: 253 mg/dL — ABNORMAL HIGH (ref 0–200)
HDL: 58.1 mg/dL (ref >39.00)
LDL Cholesterol: 177 mg/dL — ABNORMAL HIGH (ref 0–99)
NonHDL: 194.53
Total CHOL/HDL Ratio: 4
Triglycerides: 90 mg/dL (ref 0.0–149.0)
VLDL: 18 mg/dL (ref 0.0–40.0)

## 2023-11-22 LAB — MAGNESIUM: Magnesium: 2.1 mg/dL (ref 1.5–2.5)

## 2023-11-22 LAB — HEMOGLOBIN A1C: Hgb A1c MFr Bld: 5.2 % (ref 4.6–6.5)

## 2023-11-22 LAB — T4, FREE: Free T4: 0.85 ng/dL (ref 0.60–1.60)

## 2023-11-22 NOTE — Patient Instructions (Addendum)
 Good to see you today . Decrease caffien eas possible. Checking lab. Please bring your blood pressure cuff to next appointment Take blood pressure readings twice a day for 5-7   days and record .     Take 2 -3 readings at each sitting .   Can send in readings  by My Chart.     Before checking your blood pressure make sure: You are seated and quite for 5 min before checking Feet are flat on the floor Siting in chair with your back supported straight up and down Arm resting on table or arm of chair at heart level Bladder is empty You have NOT had caffeine or tobacco within the last 30 min  [email or web address]  EKG has some changes that may be lead placement   but will consider getting echocardiogram. Or other evaluation as  indicated . Shingles vaccine when convenient . Will also order ct calcium score .  PLan fu in about 2 months  with BP monitor  and go from there

## 2023-11-25 LAB — LIPOPROTEIN A (LPA): Lipoprotein (a): 32 nmol/L (ref <75)

## 2023-11-27 NOTE — Progress Notes (Signed)
 Cholesterol is up  again . Fortunately  LIPO a is in favorable range .  Awaiting  Ct calcium score and echo results \\rest  of results are normal or any out of range not clinically significant

## 2023-12-13 ENCOUNTER — Ambulatory Visit (HOSPITAL_BASED_OUTPATIENT_CLINIC_OR_DEPARTMENT_OTHER): Payer: No Typology Code available for payment source

## 2023-12-13 DIAGNOSIS — E785 Hyperlipidemia, unspecified: Secondary | ICD-10-CM | POA: Diagnosis not present

## 2023-12-13 DIAGNOSIS — R9431 Abnormal electrocardiogram [ECG] [EKG]: Secondary | ICD-10-CM | POA: Diagnosis not present

## 2023-12-13 DIAGNOSIS — R030 Elevated blood-pressure reading, without diagnosis of hypertension: Secondary | ICD-10-CM | POA: Diagnosis not present

## 2023-12-13 DIAGNOSIS — R002 Palpitations: Secondary | ICD-10-CM

## 2023-12-13 LAB — ECHOCARDIOGRAM COMPLETE
Area-P 1/2: 3.42 cm2
S' Lateral: 1.96 cm

## 2023-12-23 ENCOUNTER — Encounter: Payer: Self-pay | Admitting: Internal Medicine

## 2023-12-23 NOTE — Progress Notes (Signed)
Echo cardiogram  is normal:  function  of  pumping chambers  and valves are normal  All reassuring   as to cause of palpitations

## 2024-01-03 ENCOUNTER — Encounter: Payer: Self-pay | Admitting: Gastroenterology

## 2024-01-13 ENCOUNTER — Ambulatory Visit (HOSPITAL_BASED_OUTPATIENT_CLINIC_OR_DEPARTMENT_OTHER)
Admission: RE | Admit: 2024-01-13 | Discharge: 2024-01-13 | Disposition: A | Discharge status: Home / Self Care | Payer: Self-pay | Source: Ambulatory Visit | Attending: Internal Medicine | Admitting: Internal Medicine

## 2024-01-13 ENCOUNTER — Encounter: Payer: Self-pay | Admitting: Internal Medicine

## 2024-01-13 DIAGNOSIS — R030 Elevated blood-pressure reading, without diagnosis of hypertension: Secondary | ICD-10-CM | POA: Insufficient documentation

## 2024-01-13 DIAGNOSIS — E785 Hyperlipidemia, unspecified: Secondary | ICD-10-CM | POA: Insufficient documentation

## 2024-01-13 NOTE — Progress Notes (Signed)
Ct calcium score of 0 is favorable and reassuring.  Can repeat in 5 years  but Continue lifestyle intervention healthy eating and exercise .  Radiologist over read  pending

## 2024-01-23 NOTE — Progress Notes (Signed)
 No significant findings on non cardiac  scan

## 2024-07-30 ENCOUNTER — Other Ambulatory Visit: Payer: Self-pay | Admitting: Internal Medicine

## 2024-07-30 DIAGNOSIS — Z1231 Encounter for screening mammogram for malignant neoplasm of breast: Secondary | ICD-10-CM

## 2024-08-24 ENCOUNTER — Ambulatory Visit (HOSPITAL_BASED_OUTPATIENT_CLINIC_OR_DEPARTMENT_OTHER)
Admission: RE | Admit: 2024-08-24 | Discharge: 2024-08-24 | Disposition: A | Discharge status: Home / Self Care | Payer: Self-pay | Source: Ambulatory Visit | Attending: Internal Medicine | Admitting: Internal Medicine

## 2024-08-24 ENCOUNTER — Encounter (HOSPITAL_BASED_OUTPATIENT_CLINIC_OR_DEPARTMENT_OTHER): Payer: Self-pay | Admitting: Radiology

## 2024-08-24 DIAGNOSIS — Z1231 Encounter for screening mammogram for malignant neoplasm of breast: Secondary | ICD-10-CM | POA: Diagnosis present

## 2024-08-31 ENCOUNTER — Ambulatory Visit: Payer: Self-pay
